# Patient Record
Sex: Female | Born: 1960
Health system: Southern US, Community
[De-identification: ages and names within clinical notes are randomized; demographics above are authoritative.]

## PROBLEM LIST (undated history)

## (undated) DIAGNOSIS — I10 Essential (primary) hypertension: Secondary | ICD-10-CM

## (undated) DIAGNOSIS — E079 Disorder of thyroid, unspecified: Secondary | ICD-10-CM

## (undated) DIAGNOSIS — E119 Type 2 diabetes mellitus without complications: Secondary | ICD-10-CM

## (undated) HISTORY — PX: ABDOMINAL HYSTERECTOMY: SHX81

## (undated) HISTORY — PX: CHOLECYSTECTOMY: SHX55

---
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
## 2002-04-03 HISTORY — PX: SHOULDER SURGERY: SHX246

## 2013-12-04 ENCOUNTER — Other Ambulatory Visit: Payer: Self-pay | Admitting: Family Medicine

## 2013-12-04 DIAGNOSIS — Z1231 Encounter for screening mammogram for malignant neoplasm of breast: Secondary | ICD-10-CM

## 2013-12-18 ENCOUNTER — Ambulatory Visit
Admission: RE | Admit: 2013-12-18 | Discharge: 2013-12-18 | Disposition: A | Discharge status: Home / Self Care | Payer: 59 | Source: Ambulatory Visit | Attending: Family Medicine | Admitting: Family Medicine

## 2013-12-18 DIAGNOSIS — Z1231 Encounter for screening mammogram for malignant neoplasm of breast: Secondary | ICD-10-CM

## 2014-12-09 ENCOUNTER — Other Ambulatory Visit: Payer: Self-pay

## 2014-12-09 DIAGNOSIS — Z1231 Encounter for screening mammogram for malignant neoplasm of breast: Secondary | ICD-10-CM

## 2015-01-13 ENCOUNTER — Ambulatory Visit
Admission: RE | Admit: 2015-01-13 | Discharge: 2015-01-13 | Disposition: A | Discharge status: Home / Self Care | Payer: 59 | Source: Ambulatory Visit

## 2015-01-13 DIAGNOSIS — Z1231 Encounter for screening mammogram for malignant neoplasm of breast: Secondary | ICD-10-CM

## 2016-01-06 ENCOUNTER — Other Ambulatory Visit: Payer: Self-pay | Admitting: Family Medicine

## 2016-01-06 DIAGNOSIS — Z1231 Encounter for screening mammogram for malignant neoplasm of breast: Secondary | ICD-10-CM

## 2016-01-20 ENCOUNTER — Ambulatory Visit
Admission: RE | Admit: 2016-01-20 | Discharge: 2016-01-20 | Disposition: A | Discharge status: Home / Self Care | Payer: 59 | Source: Ambulatory Visit | Attending: Family Medicine | Admitting: Family Medicine

## 2016-01-20 DIAGNOSIS — Z1231 Encounter for screening mammogram for malignant neoplasm of breast: Secondary | ICD-10-CM

## 2016-04-01 ENCOUNTER — Ambulatory Visit (HOSPITAL_COMMUNITY)
Admission: EM | Admit: 2016-04-01 | Discharge: 2016-04-01 | Disposition: A | Discharge status: Home / Self Care | Payer: 59 | Attending: Emergency Medicine | Admitting: Emergency Medicine

## 2016-04-01 ENCOUNTER — Encounter (HOSPITAL_COMMUNITY): Payer: Self-pay | Admitting: Emergency Medicine

## 2016-04-01 ENCOUNTER — Ambulatory Visit (INDEPENDENT_AMBULATORY_CARE_PROVIDER_SITE_OTHER): Payer: 59

## 2016-04-01 DIAGNOSIS — R112 Nausea with vomiting, unspecified: Secondary | ICD-10-CM

## 2016-04-01 DIAGNOSIS — J9801 Acute bronchospasm: Secondary | ICD-10-CM

## 2016-04-01 DIAGNOSIS — J069 Acute upper respiratory infection, unspecified: Secondary | ICD-10-CM

## 2016-04-01 HISTORY — DX: Disorder of thyroid, unspecified: E07.9

## 2016-04-01 HISTORY — DX: Essential (primary) hypertension: I10

## 2016-04-01 HISTORY — DX: Type 2 diabetes mellitus without complications: E11.9

## 2016-04-01 MED ORDER — IPRATROPIUM-ALBUTEROL 0.5-2.5 (3) MG/3ML IN SOLN
3.0000 mL | Freq: Once | RESPIRATORY_TRACT | Status: AC
  Administered 2016-04-01: 3 mL via RESPIRATORY_TRACT

## 2016-04-01 MED ORDER — ONDANSETRON HCL 4 MG PO TABS: 4.0000 mg | ORAL_TABLET | Freq: Four times a day (QID) | ORAL | 0 refills | Status: AC

## 2016-04-01 MED ORDER — IPRATROPIUM-ALBUTEROL 0.5-2.5 (3) MG/3ML IN SOLN
RESPIRATORY_TRACT | Status: AC
  Filled 2016-04-01: qty 3

## 2016-04-01 MED ORDER — ONDANSETRON 4 MG PO TBDP
4.0000 mg | ORAL_TABLET | Freq: Once | ORAL | Status: AC
  Administered 2016-04-01: 4 mg via ORAL

## 2016-04-01 MED ORDER — DEXAMETHASONE SODIUM PHOSPHATE 10 MG/ML IJ SOLN
10.0000 mg | Freq: Once | INTRAMUSCULAR | Status: AC
  Administered 2016-04-01: 10 mg via INTRAMUSCULAR

## 2016-04-01 MED ORDER — PREDNISONE 50 MG PO TABS: ORAL_TABLET | ORAL | 0 refills | Status: DC

## 2016-04-01 MED ORDER — ALBUTEROL SULFATE HFA 108 (90 BASE) MCG/ACT IN AERS: 2.0000 | INHALATION_SPRAY | RESPIRATORY_TRACT | 0 refills | Status: AC | PRN

## 2016-04-01 MED ORDER — DEXAMETHASONE SODIUM PHOSPHATE 10 MG/ML IJ SOLN
INTRAMUSCULAR | Status: AC
  Filled 2016-04-01: qty 1

## 2016-04-01 MED ORDER — ONDANSETRON 4 MG PO TBDP
ORAL_TABLET | ORAL | Status: AC
  Filled 2016-04-01: qty 1

## 2016-04-01 NOTE — ED Triage Notes (Signed)
Here for SOB onset 7 days associated w/vomiting, diarrhea, nasal congestion, prod cough, wheezing  Reports SOB increases w/activity  A&O x4... NAD

## 2016-04-01 NOTE — Discharge Instructions (Signed)
Use the albuterol inhaler 2 puffs every 4 hours as needed for cough, cough spasms or shortness of breath. You may continue taking the Advil sinus over-the-counter medicine to help with upper and toward congestion, sinus drainage and runny nose. This too should help with cough. They should drink plenty of fluids and stay well-hydrated. Take the prednisone once daily for inflammation of the airways and sinuses. Take with food.

## 2016-04-01 NOTE — ED Notes (Signed)
Notified Youlanda Roys, NP of pt's condition

## 2016-04-01 NOTE — ED Provider Notes (Signed)
CSN: [id number]     Arrival date & time 04/01/16  1634 History   First MD Initiated Contact with Patient 04/01/16 1743     Chief Complaint  Patient presents with  . Shortness of Breath   (Consider location/radiation/quality/duration/timing/severity/associated sxs/prior Treatment) 55 year old female states she has been feeling bad since Christmas Day. She has had vomiting and diarrhea but not daily. Complaining of upper respiratory congestion, nasal congestion, rhinorrhea, cough, feeling cold, ribs hurting with cough, dizziness. Denies fever. She has taken Advil sinus medication and Delsym. States she does not have asthma or smoke.      Past Medical History:  Diagnosis Date  . Diabetes mellitus without complication ([place])   . Hypertension   . Thyroid disease    History reviewed. No pertinent surgical history. History reviewed. No pertinent family history. Social History  Substance Use Topics  . Smoking status: Never Smoker  . Smokeless tobacco: Never Used  . Alcohol use Yes   OB History    No data available     Review of Systems  Constitutional: Positive for activity change and fatigue. Negative for appetite change, chills and fever.  HENT: Positive for congestion, postnasal drip, rhinorrhea and sneezing. Negative for ear pain, facial swelling and trouble swallowing.   Eyes: Negative.   Respiratory: Positive for cough, chest tightness and shortness of breath.   Cardiovascular: Negative.   Gastrointestinal: Positive for diarrhea, nausea and vomiting.  Musculoskeletal: Positive for myalgias. Negative for neck pain and neck stiffness.  Skin: Negative for pallor and rash.  Neurological: Negative.   All other systems reviewed and are negative.   Allergies  Patient has no known allergies.  Home Medications   Prior to Admission medications   Medication Sig Start Date End Date Taking? Authorizing Provider  fenofibrate (TRICOR) 145 MG tablet Take 145 mg by mouth daily.   Yes  Historical Provider, MD  insulin glargine (LANTUS) 100 UNIT/ML injection Inject into the skin at bedtime.   Yes Historical Provider, MD  insulin lispro (HUMALOG) 100 UNIT/ML injection Inject into the skin 3 (three) times daily before meals.   Yes Historical Provider, MD  levothyroxine (SYNTHROID, LEVOTHROID) 125 MCG tablet Take 125 mcg by mouth daily before breakfast.   Yes Historical Provider, MD  metFORMIN (GLUCOPHAGE) 1000 MG tablet Take 1,000 mg by mouth 2 (two) times daily with a meal.   Yes Historical Provider, MD  QUEtiapine (SEROQUEL) 300 MG tablet Take 300 mg by mouth at bedtime.   Yes Historical Provider, MD  rosuvastatin (CRESTOR) 10 MG tablet Take 10 mg by mouth daily.   Yes Historical Provider, MD  venlafaxine (EFFEXOR) 25 MG tablet Take 25 mg by mouth 2 (two) times daily.   Yes Historical Provider, MD  albuterol (PROVENTIL HFA;VENTOLIN HFA) 108 (90 Base) MCG/ACT inhaler Inhale 2 puffs into the lungs every 4 (four) hours as needed for wheezing or shortness of breath. 04/01/16   Janne Napoleon, NP  ondansetron (ZOFRAN) 4 MG tablet Take 1 tablet (4 mg total) by mouth every 6 (six) hours. Prn N or V 04/01/16   Janne Napoleon, NP  predniSONE (DELTASONE) 50 MG tablet 1 tab po daily for 6 days. Take with food. 04/01/16   Janne Napoleon, NP   Meds Ordered and Administered this Visit   Medications  ipratropium-albuterol (DUONEB) 0.5-2.5 (3) MG/3ML nebulizer solution 3 mL (3 mLs Nebulization Given 04/01/16 1809)  dexamethasone (DECADRON) injection 10 mg (10 mg Intramuscular Given 04/01/16 1809)  ondansetron (ZOFRAN-ODT) disintegrating tablet 4 mg (4 mg  Oral Given 04/01/16 1810)    BP 155/75 (BP Location: Left Arm)   Pulse 92   Temp 98.3 F (36.8 C) (Oral)   Resp 22   SpO2 100%  No data found.   Physical Exam  Constitutional: She is oriented to person, place, and time. She appears well-developed and well-nourished. No distress.  HENT:  Head: Normocephalic and atraumatic.  Bilateral TMs are  retracted Oropharynx with minor erythema and scant clear PND. Otherwise clear. No exudates.  Eyes: EOM are normal.  Neck: Normal range of motion. Neck supple.  Cardiovascular: Normal rate, regular rhythm, normal heart sounds and intact distal pulses.   Pulmonary/Chest: Effort normal. No respiratory distress.  Frequent cough and cough spasms. Tidal volume is clear however with forced expiration and cough there is bilateral coarseness. Mildly prolonged expiratory phase.  Musculoskeletal: Normal range of motion. She exhibits no edema.  Lymphadenopathy:    She has no cervical adenopathy.  Neurological: She is alert and oriented to person, place, and time.  Skin: Skin is warm and dry. No rash noted.  Psychiatric: She has a normal mood and affect.  Nursing note and vitals reviewed.   Urgent Care Course   Clinical Course     Procedures (including critical care time)  Labs Review Labs Reviewed - No data to display  Imaging Review Dg Chest 2 View  Result Date: 04/01/2016 CLINICAL DATA:  Patient with cough, vomiting and diarrhea. Congestion. EXAM: CHEST  2 VIEW COMPARISON:  None. FINDINGS: Normal cardiac and mediastinal contours. No consolidative pulmonary opacities. No pleural effusion or pneumothorax. Mid thoracic spine degenerative changes. Age-indeterminate compression deformity of a mid thoracic vertebral body. Additionally, there is suggestion of possible sclerosis of this vertebral body. IMPRESSION: Suggestion of age-indeterminate compression deformity of a mid thoracic vertebral body with associated sclerosis. Consider correlation with patient history as well as dedicated thoracic spine radiographs. No acute cardiopulmonary process. Electronically Signed   By: Lovey Newcomer M.D.   On: 04/01/2016 18:32     Visual Acuity Review  Right Eye Distance:   Left Eye Distance:   Bilateral Distance:    Right Eye Near:   Left Eye Near:    Bilateral Near:         MDM   1. Acute upper  respiratory infection   2. Cough due to bronchospasm   3. Non-intractable vomiting with nausea, unspecified vomiting type    Post DuoNeb the patient states she is breathing and feeling better. Her cough is decreased. Lungs are much clearer, improved air movement and decrease and wheezes and coarseness. Use the albuterol inhaler 2 puffs every 4 hours as needed for cough, cough spasms or shortness of breath. You may continue taking the Advil sinus over-the-counter medicine to help with upper and toward congestion, sinus drainage and runny nose. This too should help with cough. They should drink plenty of fluids and stay well-hydrated. Take the prednisone once daily for inflammation of the airways and sinuses. Take with food. Meds ordered this encounter  Medications  . insulin lispro (HUMALOG) 100 UNIT/ML injection    Sig: Inject into the skin 3 (three) times daily before meals.  . insulin glargine (LANTUS) 100 UNIT/ML injection    Sig: Inject into the skin at bedtime.  Marland Kitchen levothyroxine (SYNTHROID, LEVOTHROID) 125 MCG tablet    Sig: Take 125 mcg by mouth daily before breakfast.  . rosuvastatin (CRESTOR) 10 MG tablet    Sig: Take 10 mg by mouth daily.  . fenofibrate (TRICOR) 145 MG  tablet    Sig: Take 145 mg by mouth daily.  . QUEtiapine (SEROQUEL) 300 MG tablet    Sig: Take 300 mg by mouth at bedtime.  Marland Kitchen venlafaxine (EFFEXOR) 25 MG tablet    Sig: Take 25 mg by mouth 2 (two) times daily.  . metFORMIN (GLUCOPHAGE) 1000 MG tablet    Sig: Take 1,000 mg by mouth 2 (two) times daily with a meal.  . ipratropium-albuterol (DUONEB) 0.5-2.5 (3) MG/3ML nebulizer solution 3 mL  . dexamethasone (DECADRON) injection 10 mg  . ondansetron (ZOFRAN-ODT) disintegrating tablet 4 mg  . albuterol (PROVENTIL HFA;VENTOLIN HFA) 108 (90 Base) MCG/ACT inhaler    Sig: Inhale 2 puffs into the lungs every 4 (four) hours as needed for wheezing or shortness of breath.    Dispense:  1 Inhaler    Refill:  0    Order  Specific Question:   Supervising Provider    Answer:   Melony Overly [id number]  . predniSONE (DELTASONE) 50 MG tablet    Sig: 1 tab po daily for 6 days. Take with food.    Dispense:  6 tablet    Refill:  0    Order Specific Question:   Supervising Provider    Answer:   Melony Overly [id number]  . ondansetron (ZOFRAN) 4 MG tablet    Sig: Take 1 tablet (4 mg total) by mouth every 6 (six) hours. Prn N or V    Dispense:  12 tablet    Refill:  0    Order Specific Question:   Supervising Provider    Answer:   Carmela Hurt      Janne Napoleon, NP 04/01/16 1910

## 2016-05-04 ENCOUNTER — Ambulatory Visit
Admission: RE | Admit: 2016-05-04 | Discharge: 2016-05-04 | Disposition: A | Discharge status: Home / Self Care | Payer: 59 | Source: Ambulatory Visit | Attending: Family Medicine | Admitting: Family Medicine

## 2016-05-04 ENCOUNTER — Other Ambulatory Visit: Payer: Self-pay | Admitting: Family Medicine

## 2016-05-04 DIAGNOSIS — R319 Hematuria, unspecified: Secondary | ICD-10-CM | POA: Diagnosis not present

## 2016-05-04 DIAGNOSIS — N390 Urinary tract infection, site not specified: Secondary | ICD-10-CM | POA: Diagnosis not present

## 2016-05-04 DIAGNOSIS — R109 Unspecified abdominal pain: Secondary | ICD-10-CM

## 2016-05-08 ENCOUNTER — Emergency Department (HOSPITAL_COMMUNITY)
Admission: EM | Admit: 2016-05-08 | Discharge: 2016-05-08 | Disposition: A | Discharge status: Home / Self Care | Payer: 59 | Attending: Emergency Medicine | Admitting: Emergency Medicine

## 2016-05-08 ENCOUNTER — Encounter (HOSPITAL_COMMUNITY): Payer: Self-pay

## 2016-05-08 ENCOUNTER — Emergency Department (HOSPITAL_COMMUNITY): Payer: 59

## 2016-05-08 DIAGNOSIS — R1031 Right lower quadrant pain: Secondary | ICD-10-CM | POA: Diagnosis present

## 2016-05-08 DIAGNOSIS — N289 Disorder of kidney and ureter, unspecified: Secondary | ICD-10-CM | POA: Diagnosis not present

## 2016-05-08 DIAGNOSIS — N132 Hydronephrosis with renal and ureteral calculous obstruction: Secondary | ICD-10-CM | POA: Diagnosis not present

## 2016-05-08 DIAGNOSIS — N201 Calculus of ureter: Secondary | ICD-10-CM

## 2016-05-08 DIAGNOSIS — N202 Calculus of kidney with calculus of ureter: Secondary | ICD-10-CM | POA: Diagnosis not present

## 2016-05-08 DIAGNOSIS — R748 Abnormal levels of other serum enzymes: Secondary | ICD-10-CM | POA: Diagnosis not present

## 2016-05-08 DIAGNOSIS — E119 Type 2 diabetes mellitus without complications: Secondary | ICD-10-CM | POA: Insufficient documentation

## 2016-05-08 DIAGNOSIS — D649 Anemia, unspecified: Secondary | ICD-10-CM | POA: Diagnosis not present

## 2016-05-08 DIAGNOSIS — I10 Essential (primary) hypertension: Secondary | ICD-10-CM | POA: Diagnosis not present

## 2016-05-08 DIAGNOSIS — Z794 Long term (current) use of insulin: Secondary | ICD-10-CM | POA: Diagnosis not present

## 2016-05-08 LAB — COMPREHENSIVE METABOLIC PANEL
ALBUMIN: 4 g/dL (ref 3.5–5.0)
ALT: 19 U/L (ref 14–54)
AST: 30 U/L (ref 15–41)
Alkaline Phosphatase: 59 U/L (ref 38–126)
Anion gap: 11 (ref 5–15)
BILIRUBIN TOTAL: 0.4 mg/dL (ref 0.3–1.2)
BUN: 23 mg/dL — AB (ref 6–20)
CHLORIDE: 104 mmol/L (ref 101–111)
CO2: 20 mmol/L — ABNORMAL LOW (ref 22–32)
Calcium: 9.8 mg/dL (ref 8.9–10.3)
Creatinine, Ser: 1.14 mg/dL — ABNORMAL HIGH (ref 0.44–1.00)
GFR calc Af Amer: >60 mL/min (ref >60)
GFR calc non Af Amer: 53 mL/min — ABNORMAL LOW (ref >60)
GLUCOSE: 217 mg/dL — AB (ref 65–99)
POTASSIUM: 3.8 mmol/L (ref 3.5–5.1)
Sodium: 135 mmol/L (ref 135–145)
TOTAL PROTEIN: 6 g/dL — AB (ref 6.5–8.1)

## 2016-05-08 LAB — CBC WITH DIFFERENTIAL/PLATELET
Basophils Absolute: 0 10*3/uL (ref 0.0–0.1)
Basophils Relative: 0 %
Eosinophils Absolute: 0.1 10*3/uL (ref 0.0–0.7)
Eosinophils Relative: 1 %
HEMATOCRIT: 33.5 % — AB (ref 36.0–46.0)
Hemoglobin: 11.2 g/dL — ABNORMAL LOW (ref 12.0–15.0)
LYMPHS ABS: 1.2 10*3/uL (ref 0.7–4.0)
LYMPHS PCT: 16 %
MCH: 28.9 pg (ref 26.0–34.0)
MCHC: 33.4 g/dL (ref 30.0–36.0)
MCV: 86.3 fL (ref 78.0–100.0)
MONO ABS: 0.4 10*3/uL (ref 0.1–1.0)
MONOS PCT: 6 %
NEUTROS ABS: 5.9 10*3/uL (ref 1.7–7.7)
Neutrophils Relative %: 77 %
Platelets: 193 10*3/uL (ref 150–400)
RBC: 3.88 MIL/uL (ref 3.87–5.11)
RDW: 13.9 % (ref 11.5–15.5)
WBC: 7.6 10*3/uL (ref 4.0–10.5)

## 2016-05-08 LAB — LIPASE, BLOOD: Lipase: 168 U/L — ABNORMAL HIGH (ref 11–51)

## 2016-05-08 MED ORDER — ONDANSETRON HCL 4 MG PO TABS: 4.0000 mg | ORAL_TABLET | Freq: Four times a day (QID) | ORAL | 0 refills | Status: AC | PRN

## 2016-05-08 MED ORDER — MORPHINE SULFATE (PF) 4 MG/ML IV SOLN
4.0000 mg | Freq: Once | INTRAVENOUS | Status: AC
  Administered 2016-05-08: 4 mg via INTRAVENOUS
  Filled 2016-05-08: qty 1

## 2016-05-08 MED ORDER — OXYCODONE-ACETAMINOPHEN 5-325 MG PO TABS: 1.0000 | ORAL_TABLET | ORAL | 0 refills | Status: AC | PRN

## 2016-05-08 MED ORDER — ONDANSETRON HCL 4 MG/2ML IJ SOLN
4.0000 mg | Freq: Once | INTRAMUSCULAR | Status: AC
  Administered 2016-05-08: 4 mg via INTRAVENOUS
  Filled 2016-05-08: qty 2

## 2016-05-08 NOTE — ED Notes (Signed)
Patient transported to CT 

## 2016-05-08 NOTE — ED Triage Notes (Signed)
Pt states that she has kidney stones, went to her appt on Thursday and has another one on Tuesday. Pt states hematuria has returned. C/o nausea

## 2016-05-08 NOTE — ED Provider Notes (Signed)
[facility] DEPT Provider Note   CSN: [id number] Arrival date & time: 05/08/16  [id number] By signing my name below, I, Dyke Brackett, attest that this documentation has been prepared under the direction and in the presence of Delora Fuel, MD . Electronically Signed: Dyke Brackett, Scribe. 05/08/2016. 1:18 AM.   History   Chief Complaint Chief Complaint  Patient presents with  . Nephrolithiasis    HPI Maria Stafford is a 56 y.o. female with a history of DM, HTN and thyroid disease who presents to the Emergency Department complaining of gradually worsening, intermittent, moderate RLQ pain onset four days ago. Per pt, she had an x-ray and was dx with kidney stones at her PCP four days ago. She notes associated spasmatic pelvic pain, bilateral flank pain, nausea, vomiting, dysuria, hematuria, and increased urinary frequency. Pt has taken Ciprofloxacin, Ketoralac, and tamsulosin with no relief of symptoms. No alleviating or modifying factors noted. She states that she has a f/u appointment at her PCP tomorrow where she is to have another x-ray. She denies any fever, chills, sweats, or any other symptoms.   The history is provided by the patient. No language interpreter was used.   Past Medical History:  Diagnosis Date  . Diabetes mellitus without complication ([place])   . Hypertension   . Thyroid disease     There are no active problems to display for this patient.   History reviewed. No pertinent surgical history.  OB History    No data available     Home Medications    Prior to Admission medications   Medication Sig Start Date End Date Taking? Authorizing Provider  albuterol (PROVENTIL HFA;VENTOLIN HFA) 108 (90 Base) MCG/ACT inhaler Inhale 2 puffs into the lungs every 4 (four) hours as needed for wheezing or shortness of breath. 04/01/16   Janne Napoleon, NP  fenofibrate (TRICOR) 145 MG tablet Take 145 mg by mouth daily.    Historical Provider, MD  insulin glargine (LANTUS) 100 UNIT/ML  injection Inject into the skin at bedtime.    Historical Provider, MD  insulin lispro (HUMALOG) 100 UNIT/ML injection Inject into the skin 3 (three) times daily before meals.    Historical Provider, MD  levothyroxine (SYNTHROID, LEVOTHROID) 125 MCG tablet Take 125 mcg by mouth daily before breakfast.    Historical Provider, MD  metFORMIN (GLUCOPHAGE) 1000 MG tablet Take 1,000 mg by mouth 2 (two) times daily with a meal.    Historical Provider, MD  ondansetron (ZOFRAN) 4 MG tablet Take 1 tablet (4 mg total) by mouth every 6 (six) hours. Prn N or V 04/01/16   Janne Napoleon, NP  predniSONE (DELTASONE) 50 MG tablet 1 tab po daily for 6 days. Take with food. 04/01/16   Janne Napoleon, NP  QUEtiapine (SEROQUEL) 300 MG tablet Take 300 mg by mouth at bedtime.    Historical Provider, MD  rosuvastatin (CRESTOR) 10 MG tablet Take 10 mg by mouth daily.    Historical Provider, MD  venlafaxine (EFFEXOR) 25 MG tablet Take 25 mg by mouth 2 (two) times daily.    Historical Provider, MD    Family History No family history on file.  Social History Social History  Substance Use Topics  . Smoking status: Never Smoker  . Smokeless tobacco: Never Used  . Alcohol use Yes     Allergies   Patient has no known allergies.   Review of Systems Review of Systems  Constitutional: Negative for chills, diaphoresis and fever.  Gastrointestinal: Positive for abdominal pain, nausea and vomiting.  Genitourinary: Positive for dysuria, flank pain, frequency, hematuria and pelvic pain.  All other systems reviewed and are negative.  Physical Exam Updated Vital Signs BP 152/66   Pulse 106   Temp 98 F (36.7 C)   Resp 18   SpO2 96%   Physical Exam  Constitutional: She is oriented to person, place, and time. She appears well-developed and well-nourished.  HENT:  Head: Normocephalic and atraumatic.  Eyes: EOM are normal. Pupils are equal, round, and reactive to light.  Neck: Normal range of motion. Neck supple. No JVD  present.  Cardiovascular: Normal rate, regular rhythm and normal heart sounds.   No murmur heard. Pulmonary/Chest: Effort normal and breath sounds normal. She has no wheezes. She has no rales. She exhibits no tenderness.  Abdominal: Soft. Bowel sounds are normal. She exhibits no distension and no mass. There is tenderness.  Mild to moderate tenderness diffusely, with maximum tenderness in RLQ. Bilateral CVA tenderness present.   Musculoskeletal: Normal range of motion. She exhibits no edema.  Lymphadenopathy:    She has no cervical adenopathy.  Neurological: She is alert and oriented to person, place, and time. No cranial nerve deficit. She exhibits normal muscle tone. Coordination normal.  Skin: Skin is warm and dry. No rash noted.  Psychiatric: She has a normal mood and affect. Her behavior is normal. Judgment and thought content normal.  Nursing note and vitals reviewed.   ED Treatments / Results  DIAGNOSTIC STUDIES:  Oxygen Saturation is 96% on RA, normal by my interpretation.    COORDINATION OF CARE:  1:18 AM Discussed treatment plan with pt at bedside and pt agreed to plan.   Labs (all labs ordered are listed, but only abnormal results are displayed) Labs Reviewed  COMPREHENSIVE METABOLIC PANEL - Abnormal; Notable for the following:       Result Value   CO2 20 (*)    Glucose, Bld 217 (*)    BUN 23 (*)    Creatinine, Ser 1.14 (*)    Total Protein 6.0 (*)    GFR calc non Af Amer 53 (*)    All other components within normal limits  LIPASE, BLOOD - Abnormal; Notable for the following:    Lipase 168 (*)    All other components within normal limits  CBC WITH DIFFERENTIAL/PLATELET - Abnormal; Notable for the following:    Hemoglobin 11.2 (*)    HCT 33.5 (*)    All other components within normal limits    Radiology Ct Renal Stone Study  Result Date: 05/08/2016 CLINICAL DATA:  Recurrent hematuria and kidney stones. RIGHT lower quadrant pain, nausea. History of  hysterectomy, hypertension and diabetes. EXAM: CT ABDOMEN AND PELVIS WITHOUT CONTRAST TECHNIQUE: Multidetector CT imaging of the abdomen and pelvis was performed following the standard protocol without IV contrast. COMPARISON:  Abdominal radiograph May 04, 2016 FINDINGS: LOWER CHEST: Lung bases are clear. The visualized heart size is normal. No pericardial effusion. HEPATOBILIARY: Negative liver.  Status post cholecystectomy. PANCREAS: Normal. SPLEEN: Normal. ADRENALS/URINARY TRACT: Kidneys are orthotopic, demonstrating normal size and morphology. Mild RIGHT hydroureteronephrosis to the level of the ureterovesicular junction where a 3 mm calculus is present. Limited assessment for renal masses on this nonenhanced examination. Mild RIGHT perinephric fat stranding. No nephrolithiasis. Urinary bladder is partially distended and unremarkable. Normal adrenal glands. STOMACH/BOWEL: The stomach, small and large bowel are normal in course and caliber without inflammatory changes, sensitivity decreased by lack of enteric contrast. A few scattered colonic diverticula. The appendix is not discretely identified,  however there are no inflammatory changes in the right lower quadrant. VASCULAR/LYMPHATIC: Aortoiliac vessels are normal in course and caliber, trace calcific atherosclerosis. Phleboliths LEFT ovarian vein. No lymphadenopathy by CT size criteria. REPRODUCTIVE: Status post hysterectomy. OTHER: No intraperitoneal free fluid or free air. Calcified granuloma RIGHT anterior abdomen accounting for prior abdominal radiograph findings. MUSCULOSKELETAL: Non-acute. Moderate lower lumbar facet arthropathy. Anterior pelvic wall scarring. IMPRESSION: 3 mm RIGHT ureterovesicular junction calculus results in mild obstructive uropathy. No nephrolithiasis. Electronically Signed   By: Elon Alas M.D.   On: 05/08/2016 01:46    Procedures Procedures (including critical care time)  Medications Ordered in ED Medications    morphine 4 MG/ML injection 4 mg (4 mg Intravenous Given 05/08/16 0202)  ondansetron (ZOFRAN) injection 4 mg (4 mg Intravenous Given 05/08/16 0154)     Initial Impression / Assessment and Plan / ED Course  I have reviewed the triage vital signs and the nursing notes.  Pertinent labs & imaging results that were available during my care of the patient were reviewed by me and considered in my medical decision making (see chart for details).  Abdominal and flank pain of uncertain cause. Patient claims that she was diagnosed with kidney stone, but I reviewed the x-rays and the calcification seems much more likely to be a phlebolith than a kidney stone. We'll send for CT to clarify.  CT scan does show a 3 mm calculus at the right ureterovesical junction which is probably causing most of her pain. Laboratory workup shows elevated lipase but pancreas is normal-appearing on CT scan. Doubt she has pancreatitis. She feels much better after getting a dose of morphine and ondansetron. She is discharged with prescription for oxycodone have acetaminophen and ondansetron and is referred to urology for follow-up. Return precautions discussed.  Final Clinical Impressions(s) / ED Diagnoses   Final diagnoses:  Right distal ureteral calculus  Elevated lipase  Normochromic normocytic anemia  Renal insufficiency    New Prescriptions New Prescriptions   ONDANSETRON (ZOFRAN) 4 MG TABLET    Take 1 tablet (4 mg total) by mouth every 6 (six) hours as needed for nausea or vomiting.   OXYCODONE-ACETAMINOPHEN (PERCOCET) 5-325 MG TABLET    Take 1 tablet by mouth every 4 (four) hours as needed for moderate pain.   I personally performed the services described in this documentation, which was scribed in my presence. The recorded information has been reviewed and is accurate.       Delora Fuel, MD [id number]

## 2016-05-08 NOTE — Discharge Instructions (Signed)
Continue taking tamsulosin once a day. If ketorolac is causing stomach upset, you may substitute ibuprofen or naproxen.  If you develop a fever, go to the emergency department immediately!

## 2016-05-09 DIAGNOSIS — N200 Calculus of kidney: Secondary | ICD-10-CM | POA: Diagnosis not present

## 2016-05-26 DIAGNOSIS — E039 Hypothyroidism, unspecified: Secondary | ICD-10-CM | POA: Diagnosis not present

## 2016-05-26 DIAGNOSIS — E119 Type 2 diabetes mellitus without complications: Secondary | ICD-10-CM | POA: Diagnosis not present

## 2016-05-26 DIAGNOSIS — Z794 Long term (current) use of insulin: Secondary | ICD-10-CM | POA: Diagnosis not present

## 2016-05-30 DIAGNOSIS — E119 Type 2 diabetes mellitus without complications: Secondary | ICD-10-CM | POA: Diagnosis not present

## 2016-05-30 DIAGNOSIS — E782 Mixed hyperlipidemia: Secondary | ICD-10-CM | POA: Diagnosis not present

## 2016-05-30 DIAGNOSIS — I10 Essential (primary) hypertension: Secondary | ICD-10-CM | POA: Diagnosis not present

## 2016-07-12 DIAGNOSIS — J309 Allergic rhinitis, unspecified: Secondary | ICD-10-CM | POA: Diagnosis not present

## 2016-07-12 DIAGNOSIS — I10 Essential (primary) hypertension: Secondary | ICD-10-CM | POA: Diagnosis not present

## 2016-07-12 DIAGNOSIS — H109 Unspecified conjunctivitis: Secondary | ICD-10-CM | POA: Diagnosis not present

## 2016-07-14 DIAGNOSIS — I10 Essential (primary) hypertension: Secondary | ICD-10-CM | POA: Diagnosis not present

## 2016-07-25 DIAGNOSIS — H9393 Unspecified disorder of ear, bilateral: Secondary | ICD-10-CM | POA: Diagnosis not present

## 2016-07-25 DIAGNOSIS — J309 Allergic rhinitis, unspecified: Secondary | ICD-10-CM | POA: Diagnosis not present

## 2016-08-29 DIAGNOSIS — Z794 Long term (current) use of insulin: Secondary | ICD-10-CM | POA: Diagnosis not present

## 2016-08-29 DIAGNOSIS — E119 Type 2 diabetes mellitus without complications: Secondary | ICD-10-CM | POA: Diagnosis not present

## 2016-08-29 DIAGNOSIS — E039 Hypothyroidism, unspecified: Secondary | ICD-10-CM | POA: Diagnosis not present

## 2016-08-31 DIAGNOSIS — E782 Mixed hyperlipidemia: Secondary | ICD-10-CM | POA: Diagnosis not present

## 2016-08-31 DIAGNOSIS — E119 Type 2 diabetes mellitus without complications: Secondary | ICD-10-CM | POA: Diagnosis not present

## 2016-08-31 DIAGNOSIS — I10 Essential (primary) hypertension: Secondary | ICD-10-CM | POA: Diagnosis not present

## 2016-09-12 DIAGNOSIS — J309 Allergic rhinitis, unspecified: Secondary | ICD-10-CM | POA: Diagnosis not present

## 2016-09-12 DIAGNOSIS — D179 Benign lipomatous neoplasm, unspecified: Secondary | ICD-10-CM | POA: Diagnosis not present

## 2016-09-12 DIAGNOSIS — T753XXA Motion sickness, initial encounter: Secondary | ICD-10-CM | POA: Diagnosis not present

## 2016-11-14 DIAGNOSIS — E119 Type 2 diabetes mellitus without complications: Secondary | ICD-10-CM | POA: Diagnosis not present

## 2016-11-14 DIAGNOSIS — E039 Hypothyroidism, unspecified: Secondary | ICD-10-CM | POA: Diagnosis not present

## 2016-11-14 DIAGNOSIS — Z794 Long term (current) use of insulin: Secondary | ICD-10-CM | POA: Diagnosis not present

## 2016-11-16 DIAGNOSIS — E119 Type 2 diabetes mellitus without complications: Secondary | ICD-10-CM | POA: Diagnosis not present

## 2016-11-16 DIAGNOSIS — I10 Essential (primary) hypertension: Secondary | ICD-10-CM | POA: Diagnosis not present

## 2016-11-16 DIAGNOSIS — E782 Mixed hyperlipidemia: Secondary | ICD-10-CM | POA: Diagnosis not present

## 2016-11-17 DIAGNOSIS — Z0000 Encounter for general adult medical examination without abnormal findings: Secondary | ICD-10-CM | POA: Diagnosis not present

## 2016-11-20 DIAGNOSIS — M7542 Impingement syndrome of left shoulder: Secondary | ICD-10-CM | POA: Diagnosis not present

## 2016-11-21 ENCOUNTER — Other Ambulatory Visit: Payer: Self-pay | Admitting: Family Medicine

## 2016-11-21 DIAGNOSIS — Z0000 Encounter for general adult medical examination without abnormal findings: Secondary | ICD-10-CM | POA: Diagnosis not present

## 2016-11-21 DIAGNOSIS — I10 Essential (primary) hypertension: Secondary | ICD-10-CM | POA: Diagnosis not present

## 2016-11-21 DIAGNOSIS — Z23 Encounter for immunization: Secondary | ICD-10-CM | POA: Diagnosis not present

## 2016-11-21 DIAGNOSIS — Z1231 Encounter for screening mammogram for malignant neoplasm of breast: Secondary | ICD-10-CM

## 2016-12-05 DIAGNOSIS — M7502 Adhesive capsulitis of left shoulder: Secondary | ICD-10-CM | POA: Diagnosis not present

## 2016-12-05 DIAGNOSIS — M25512 Pain in left shoulder: Secondary | ICD-10-CM | POA: Diagnosis not present

## 2016-12-05 DIAGNOSIS — M75112 Incomplete rotator cuff tear or rupture of left shoulder, not specified as traumatic: Secondary | ICD-10-CM | POA: Diagnosis not present

## 2016-12-05 DIAGNOSIS — S46212A Strain of muscle, fascia and tendon of other parts of biceps, left arm, initial encounter: Secondary | ICD-10-CM | POA: Diagnosis not present

## 2016-12-06 DIAGNOSIS — I10 Essential (primary) hypertension: Secondary | ICD-10-CM | POA: Diagnosis not present

## 2016-12-06 DIAGNOSIS — M7580 Other shoulder lesions, unspecified shoulder: Secondary | ICD-10-CM | POA: Diagnosis not present

## 2016-12-06 DIAGNOSIS — Z23 Encounter for immunization: Secondary | ICD-10-CM | POA: Diagnosis not present

## 2016-12-06 DIAGNOSIS — E119 Type 2 diabetes mellitus without complications: Secondary | ICD-10-CM | POA: Diagnosis not present

## 2016-12-19 DIAGNOSIS — T753XXA Motion sickness, initial encounter: Secondary | ICD-10-CM | POA: Diagnosis not present

## 2016-12-19 DIAGNOSIS — I10 Essential (primary) hypertension: Secondary | ICD-10-CM | POA: Diagnosis not present

## 2016-12-19 DIAGNOSIS — Z23 Encounter for immunization: Secondary | ICD-10-CM | POA: Diagnosis not present

## 2016-12-28 DIAGNOSIS — M25512 Pain in left shoulder: Secondary | ICD-10-CM | POA: Diagnosis not present

## 2016-12-28 DIAGNOSIS — M7502 Adhesive capsulitis of left shoulder: Secondary | ICD-10-CM | POA: Diagnosis not present

## 2016-12-28 DIAGNOSIS — M7542 Impingement syndrome of left shoulder: Secondary | ICD-10-CM | POA: Diagnosis not present

## 2016-12-28 DIAGNOSIS — M19012 Primary osteoarthritis, left shoulder: Secondary | ICD-10-CM | POA: Diagnosis not present

## 2016-12-28 DIAGNOSIS — G8918 Other acute postprocedural pain: Secondary | ICD-10-CM | POA: Diagnosis not present

## 2016-12-28 HISTORY — PX: SHOULDER SURGERY: SHX246

## 2017-01-01 ENCOUNTER — Encounter: Payer: Self-pay | Admitting: Physical Therapy

## 2017-01-01 ENCOUNTER — Ambulatory Visit: Payer: 59 | Attending: Physician Assistant | Admitting: Physical Therapy

## 2017-01-01 DIAGNOSIS — M6281 Muscle weakness (generalized): Secondary | ICD-10-CM

## 2017-01-01 DIAGNOSIS — M25612 Stiffness of left shoulder, not elsewhere classified: Secondary | ICD-10-CM | POA: Diagnosis not present

## 2017-01-01 DIAGNOSIS — M25512 Pain in left shoulder: Secondary | ICD-10-CM | POA: Diagnosis not present

## 2017-01-01 NOTE — Therapy (Signed)
[place], [place], [address] Phone: [phone]   Fax:  [phone]  Physical Therapy Evaluation  Patient Details  Name: Maria Stafford MRN: [id number] Date of Birth: [birth year] Referring Provider: Durene Fruits, PA-C Blanche East, MD - surgeon)  Encounter Date: 01/01/2017      PT End of Session - 01/01/17 1021    Visit Number 1   Number of Visits 13   Date for PT Re-Evaluation 02/16/17   Authorization Type UHC   PT Start Time 1021   PT Stop Time 1107   PT Time Calculation (min) 46 min   Activity Tolerance Patient tolerated treatment well   Behavior During Therapy [facility] for tasks assessed/performed      Past Medical History:  Diagnosis Date  . Diabetes mellitus without complication ([place])   . Hypertension   . Thyroid disease     Past Surgical History:  Procedure Laterality Date  . ABDOMINAL HYSTERECTOMY    . APPENDECTOMY  1980  . [place]  . CHOLECYSTECTOMY    . SHOULDER SURGERY Right 2004  . SHOULDER SURGERY Left 12/28/2016    There were no vitals filed for this visit.       Subjective Assessment - 01/01/17 1026    Subjective Pt reports manipulation & decompression last Thursday. It hurts really bad. Has been trying to reach and uses pulleys at home. difficulty reaching behind back. Feeling nauseated today, did not sleep well.    Patient Stated Goals driving, reaching   Currently in Pain? Yes   Pain Score 7    Pain Location Shoulder   Pain Orientation Left   Pain Descriptors / Indicators Sore;Sharp   Aggravating Factors  moving around   Pain Relieving Factors ice, pulleys            [facility] PT Assessment - 01/01/17 0001      Assessment   Medical Diagnosis L shoulder impingement & adhesive capsulitis   Referring Provider Kyla Balzarine Wall, PA-C  Blanche East, MD - surgeon   Onset Date/Surgical Date 12/28/16   Hand Dominance Right   Next MD Visit  --  not scheduled   Prior Therapy no     Precautions   Precautions None     Restrictions   Weight Bearing Restrictions No     Balance Screen   Has the patient fallen in the past 6 months No     [place] residence   Living Arrangements Spouse/significant other     Prior Function   Level of Independence Independent   Vocation --  not working     Cognition   Overall Cognitive Status Within Functional Limits for tasks assessed     Sensation   Additional Comments WFL     ROM / Strength   AROM / PROM / Strength AROM;Strength     AROM   Overall AROM Comments PROM WFL with pain   AROM Assessment Site Shoulder   Right/Left Shoulder Left   Left Shoulder Flexion 125 Degrees   Left Shoulder ABduction 100 Degrees   Left Shoulder Internal Rotation --  L4, not to midline   Left Shoulder External Rotation --  C6     Strength   Overall Strength Comments gross GHJ strength 3-/5; R grup 40lb, L 35   Strength Assessment Site Shoulder   Right/Left Shoulder Left     Palpation   Palpation comment denies TTP  Objective measurements completed on examination: See above findings.          [facility] Adult PT Treatment/Exercise - 01/01/17 0001      Exercises   Exercises Shoulder     Shoulder Exercises: Supine   External Rotation Limitations with wand   Flexion 12 reps   Flexion Limitations flexion with wand     Shoulder Exercises: Stretch   Internal Rotation Stretch Limitations with strap behind back   Other Shoulder Stretches pendulums     Modalities   Modalities Cryotherapy     Cryotherapy   Number Minutes Cryotherapy 10 Minutes  2 min concurrent with HEP + POC education   Cryotherapy Location Shoulder   Type of Cryotherapy Ice pack     Manual Therapy   Manual Therapy Passive ROM   Passive ROM L GHJ flexion, ER                PT Education - 01/01/17 1443    Education provided Yes   Education Details  anatomy of condition, POC, HEP, exercise form/rationale   Person(s) Educated Patient   Methods Explanation;Demonstration;Tactile cues;Verbal cues;Handout   Comprehension Verbalized understanding;Returned demonstration;Verbal cues required;Tactile cues required;Need further instruction          PT Short Term Goals - 01/01/17 1444      PT SHORT TERM GOAL #1   Title Improved flexion and abduction by 15 deg actively for improved overhead reach   Baseline see flowsheet   Time 3   Period Weeks   Status New   Target Date 01/26/17     PT SHORT TERM GOAL #2   Title Average pain to <=5/10 to decrease pain limitations on daily activities   Baseline 7/10 at eval   Time 3   Period Weeks   Status New   Target Date 01/26/17           PT Long Term Goals - 01/01/17 1446      PT LONG TERM GOAL #1   Title Pt will be able to reach overhead without limitations by shoulder pain   Baseline pain with reaching at eval   Time 6   Period Weeks   Status New   Target Date 02/16/17     PT LONG TERM GOAL #2   Title Pt will be able to don/doff all clothing without limitation by shoulder pain   Baseline unable to reach bra clasp at eval   Time 6   Period Weeks   Status New   Target Date 02/16/17     PT LONG TERM GOAL #3   Title Gross GHJ strength to 5/5 for proper support to UE biomechanical chain   Baseline gross 3-/5 at eval   Time 6   Period Weeks   Status New   Target Date 02/16/17     PT LONG TERM GOAL #4   Title Pt will be able to drive without limitation by shoulder pain   Baseline pain at eval   Time 6   Period Weeks   Status New   Target Date 02/16/17                Plan - 01/01/17 1315    Clinical Impression Statement Pt presents to PT with complaints of L shoulder pain s/p manipulation & decompression (pt reported) last Thursday, diagnosis of L shoulder impingement & adhesive capsulitis. Pt demo good PROM with pain and limited AROM due to pain. No signs of  infection. Pt will benefit from  skilled PT in order to improve functional use of L upper extremity.    History and Personal Factors relevant to plan of care: DM, HTN   Clinical Presentation Stable   Clinical Presentation due to: n/a   Clinical Decision Making Low   Rehab Potential Good   PT Frequency 2x / week   PT Duration 6 weeks   PT Treatment/Interventions ADLs/Self Care Home Management;Cryotherapy;Electrical Stimulation;Functional mobility training;Ultrasound;Moist Heat;Therapeutic activities;Therapeutic exercise;Neuromuscular re-education;Patient/family education;Passive range of motion;Manual techniques;Taping   PT Next Visit Plan passive & AAROM to tolerance, periscapular strengthening   PT Home Exercise Plan scapular retraction, supine flx with wand, supine ER wand, IR with strap   Consulted and Agree with Plan of Care Patient      Patient will benefit from skilled therapeutic intervention in order to improve the following deficits and impairments:  Decreased range of motion, Impaired UE functional use, Increased muscle spasms, Decreased activity tolerance, Pain, Improper body mechanics, Decreased strength, Postural dysfunction  Visit Diagnosis: Acute pain of left shoulder  Stiffness of left shoulder, not elsewhere classified  Muscle weakness (generalized)     Problem List There are no active problems to display for this patient. Kateland Leisinger C. Erasmo Vertz PT, DPT 01/01/17 2:51 PM   [facility] [address] [place], [place], [address] Phone: [phone]   Fax:  [phone]  Name: Jaleena Viviani MRN: [id number] Date of Birth: [birth year]

## 2017-01-02 ENCOUNTER — Ambulatory Visit: Payer: 59 | Admitting: Physical Therapy

## 2017-01-02 DIAGNOSIS — M6281 Muscle weakness (generalized): Secondary | ICD-10-CM

## 2017-01-02 DIAGNOSIS — M25612 Stiffness of left shoulder, not elsewhere classified: Secondary | ICD-10-CM

## 2017-01-02 DIAGNOSIS — M25512 Pain in left shoulder: Secondary | ICD-10-CM | POA: Diagnosis not present

## 2017-01-02 NOTE — Therapy (Signed)
Maria Stafford, [place], [address] Phone: [phone]   Fax:  [phone]  Physical Therapy Treatment  Patient Details  Name: Maria Stafford MRN: [id number] Date of Birth: November 23, 1960 Referring Provider: Durene Fruits, PA-C Blanche East, MD - surgeon)  Encounter Date: 01/02/2017      PT End of Session - 01/02/17 1143    Visit Number 2   Number of Visits 13   Date for PT Re-Evaluation 02/16/17   Authorization Type UHC   PT Start Time 1100   PT Stop Time 1155   PT Time Calculation (min) 55 min      Past Medical History:  Diagnosis Date  . Diabetes mellitus without complication ([place])   . Hypertension   . Thyroid disease     Past Surgical History:  Procedure Laterality Date  . ABDOMINAL HYSTERECTOMY    . APPENDECTOMY  1980  . [place]  . CHOLECYSTECTOMY    . SHOULDER SURGERY Right 2004  . SHOULDER SURGERY Left 12/28/2016    There were no vitals filed for this visit.      Subjective Assessment - 01/02/17 1101    Currently in Pain? Yes   Pain Score 7    Pain Location Shoulder   Pain Orientation Left;Lateral   Pain Descriptors / Indicators Sore;Sharp;Stabbing            [facility] PT Assessment - 01/02/17 0001      AROM   Left Shoulder External Rotation 40 Degrees                     [facility] Adult PT Treatment/Exercise - 01/02/17 0001      Shoulder Exercises: Supine   External Rotation Limitations with wand   Flexion 15 reps   Flexion Limitations flexion with wand     Shoulder Exercises: Standing   Other Standing Exercises UE ranger >90 flexion, horizontal abduction/adduction, ER , IR behind back at low level    Other Standing Exercises wall ladder flexion 5 x      Shoulder Exercises: Stretch   Internal Rotation Stretch Limitations opp hand assist behind back      Cryotherapy   Number Minutes Cryotherapy 15 Minutes   Cryotherapy Location  Shoulder   Type of Cryotherapy Ice pack     Manual Therapy   Passive ROM L GHJ flexion, ER, abducion below 90                PT Education - 01/01/17 1443    Education provided Yes   Education Details anatomy of condition, POC, HEP, exercise form/rationale   Person(s) Educated Patient   Methods Explanation;Demonstration;Tactile cues;Verbal cues;Handout   Comprehension Verbalized understanding;Returned demonstration;Verbal cues required;Tactile cues required;Need further instruction          PT Short Term Goals - 01/01/17 1444      PT SHORT TERM GOAL #1   Title Improved flexion and abduction by 15 deg actively for improved overhead reach   Baseline see flowsheet   Time 3   Period Weeks   Status New   Target Date 01/26/17     PT SHORT TERM GOAL #2   Title Average pain to <=5/10 to decrease pain limitations on daily activities   Baseline 7/10 at eval   Time 3   Period Weeks   Status New   Target Date 01/26/17           PT Long Term Goals -  01/01/17 1446      PT LONG TERM GOAL #1   Title Pt will be able to reach overhead without limitations by shoulder pain   Baseline pain with reaching at eval   Time 6   Period Weeks   Status New   Target Date 02/16/17     PT LONG TERM GOAL #2   Title Pt will be able to don/doff all clothing without limitation by shoulder pain   Baseline unable to reach bra clasp at eval   Time 6   Period Weeks   Status New   Target Date 02/16/17     PT LONG TERM GOAL #3   Title Gross GHJ strength to 5/5 for proper support to UE biomechanical chain   Baseline gross 3-/5 at eval   Time 6   Period Weeks   Status New   Target Date 02/16/17     PT LONG TERM GOAL #4   Title Pt will be able to drive without limitation by shoulder pain   Baseline pain at eval   Time 6   Period Weeks   Status New   Target Date 02/16/17               Plan - 01/02/17 1141    Clinical Impression Statement Pt reports same pain and soreness  as yesterday at eval.  We reviewed HEP and used UE ranger for [place]. Very tender anterior shoulder and along clavicle. Cues for relaxation improved tolerance to PROM. Ice post session.    PT Next Visit Plan passive & AAROM to tolerance, periscapular strengthening   PT Home Exercise Plan scapular retraction, supine flx with wand, supine ER wand, IR with strap   Consulted and Agree with Plan of Care Patient      Patient will benefit from skilled therapeutic intervention in order to improve the following deficits and impairments:  Decreased range of motion, Impaired UE functional use, Increased muscle spasms, Decreased activity tolerance, Pain, Improper body mechanics, Decreased strength, Postural dysfunction  Visit Diagnosis: Acute pain of left shoulder  Stiffness of left shoulder, not elsewhere classified  Muscle weakness (generalized)     Problem List There are no active problems to display for this patient.   Dorene Ar, [place] 01/02/2017, 11:44 AM  [facility] [address] [place], [place], [address] Phone: [phone]   Fax:  [phone]  Name: Maria Stafford MRN: [id number] Date of Birth: 13-Oct-1960

## 2017-01-08 ENCOUNTER — Ambulatory Visit: Payer: 59 | Admitting: Physical Therapy

## 2017-01-08 DIAGNOSIS — M25512 Pain in left shoulder: Secondary | ICD-10-CM

## 2017-01-08 DIAGNOSIS — M6281 Muscle weakness (generalized): Secondary | ICD-10-CM

## 2017-01-08 DIAGNOSIS — M25612 Stiffness of left shoulder, not elsewhere classified: Secondary | ICD-10-CM

## 2017-01-08 NOTE — Therapy (Signed)
[place], [place], [address] Phone: [phone]   Fax:  [phone]  Physical Therapy Treatment  Patient Details  Name: Maria Stafford MRN: [id number] Date of Birth: [birth year] Referring Provider: Durene Fruits, PA-C Blanche East, MD - surgeon)  Encounter Date: 01/08/2017      PT End of Session - 01/08/17 1112    Visit Number 3   Number of Visits 13   Date for PT Re-Evaluation 02/16/17   Authorization Type UHC   PT Start Time 1110  10 minutes    PT Stop Time 1200   PT Time Calculation (min) 50 min      Past Medical History:  Diagnosis Date  . Diabetes mellitus without complication ([place])   . Hypertension   . Thyroid disease     Past Surgical History:  Procedure Laterality Date  . ABDOMINAL HYSTERECTOMY    . APPENDECTOMY  1980  . [place]  . CHOLECYSTECTOMY    . SHOULDER SURGERY Right 2004  . SHOULDER SURGERY Left 12/28/2016    There were no vitals filed for this visit.      Subjective Assessment - 01/08/17 1111    Subjective I have turned a corner with the pain. Its better.    Currently in Pain? Yes   Pain Score 5    Pain Location Shoulder   Pain Orientation Left   Pain Descriptors / Indicators Aching   Aggravating Factors  reachinging back behind head or back    Pain Relieving Factors ice pulleys             [facility] PT Assessment - 01/08/17 0001      AROM   Left Shoulder Flexion 132 Degrees   Left Shoulder ABduction 128 Degrees   Left Shoulder Internal Rotation --  reach to L1   Left Shoulder External Rotation --  Reach to T2                      [facility] Adult PT Treatment/Exercise - 01/08/17 0001      Shoulder Exercises: Supine   Horizontal ABduction AAROM;10 reps   External Rotation Limitations with wand   Flexion 15 reps   Flexion Limitations flexion with wand     Shoulder Exercises: Standing   Internal Rotation AAROM;10  reps   Internal Rotation Limitations cane   Flexion AAROM;Left;10 reps   Flexion Limitations cane    ABduction AAROM;10 reps   ABduction Limitations cane   Extension AAROM;10 reps   Extension Limitations cane     Shoulder Exercises: Pulleys   Flexion 2 minutes     Cryotherapy   Number Minutes Cryotherapy 15 Minutes   Cryotherapy Location Shoulder   Type of Cryotherapy Ice pack     Manual Therapy   Passive ROM L GHJ flexion, ER, abducion below 90 only tolerated                   PT Short Term Goals - 01/01/17 1444      PT SHORT TERM GOAL #1   Title Improved flexion and abduction by 15 deg actively for improved overhead reach   Baseline see flowsheet   Time 3   Period Weeks   Status New   Target Date 01/26/17     PT SHORT TERM GOAL #2   Title Average pain to <=5/10 to decrease pain limitations on daily activities   Baseline 7/10 at eval  Time 3   Period Weeks   Status New   Target Date 01/26/17           PT Long Term Goals - 01/01/17 1446      PT LONG TERM GOAL #1   Title Pt will be able to reach overhead without limitations by shoulder pain   Baseline pain with reaching at eval   Time 6   Period Weeks   Status New   Target Date 02/16/17     PT LONG TERM GOAL #2   Title Pt will be able to don/doff all clothing without limitation by shoulder pain   Baseline unable to reach bra clasp at eval   Time 6   Period Weeks   Status New   Target Date 02/16/17     PT LONG TERM GOAL #3   Title Gross GHJ strength to 5/5 for proper support to UE biomechanical chain   Baseline gross 3-/5 at eval   Time 6   Period Weeks   Status New   Target Date 02/16/17     PT LONG TERM GOAL #4   Title Pt will be able to drive without limitation by shoulder pain   Baseline pain at eval   Time 6   Period Weeks   Status New   Target Date 02/16/17               Plan - 01/08/17 1127    Clinical Impression Statement Improving ROM, see objective measures.   Decreasing pain. Pt reports significant pain with reaching behind back and reaching at 90/90. Focused PROM and AAROM exercises as tolerated. Began scapular strengthening and doorway stretch with good tolearnce.    PT Next Visit Plan passive & AAROM to tolerance, periscapular strengthening   PT Home Exercise Plan scapular retraction, supine flx with wand, supine ER wand, IR with strap   Consulted and Agree with Plan of Care Patient      Patient will benefit from skilled therapeutic intervention in order to improve the following deficits and impairments:  Decreased range of motion, Impaired UE functional use, Increased muscle spasms, Decreased activity tolerance, Pain, Improper body mechanics, Decreased strength, Postural dysfunction  Visit Diagnosis: Acute pain of left shoulder  Stiffness of left shoulder, not elsewhere classified  Muscle weakness (generalized)     Problem List There are no active problems to display for this patient.   Dorene Ar, [place] 01/08/2017, 11:49 AM  [place], [place], [address] Phone: [phone]   Fax:  [phone]  Name: Maria Stafford MRN: [id number] Date of Birth: [birth year]

## 2017-01-09 ENCOUNTER — Ambulatory Visit: Payer: 59 | Admitting: Physical Therapy

## 2017-01-09 ENCOUNTER — Encounter: Payer: Self-pay | Admitting: Physical Therapy

## 2017-01-09 DIAGNOSIS — M25512 Pain in left shoulder: Secondary | ICD-10-CM | POA: Diagnosis not present

## 2017-01-09 DIAGNOSIS — M6281 Muscle weakness (generalized): Secondary | ICD-10-CM

## 2017-01-09 DIAGNOSIS — M25612 Stiffness of left shoulder, not elsewhere classified: Secondary | ICD-10-CM

## 2017-01-09 NOTE — Therapy (Addendum)
[place], [place], [address] Phone: [phone]   Fax:  [phone]  Physical Therapy Treatment/Discharge Summary  Patient Details  Name: Maria Stafford MRN: [id number] Date of Birth: 1960-06-20 Referring Provider: Durene Fruits, PA-C Blanche East, MD - surgeon)  Encounter Date: 01/09/2017      PT End of Session - 01/09/17 1146    Visit Number 4   Number of Visits 13   Date for PT Re-Evaluation 02/16/17   Authorization Type UHC   PT Start Time 1146   PT Stop Time 1233   PT Time Calculation (min) 47 min   Activity Tolerance Patient tolerated treatment well   Behavior During Therapy [facility] for tasks assessed/performed      Past Medical History:  Diagnosis Date  . Diabetes mellitus without complication ([place])   . Hypertension   . Thyroid disease     Past Surgical History:  Procedure Laterality Date  . ABDOMINAL HYSTERECTOMY    . APPENDECTOMY  1980  . [place]  . CHOLECYSTECTOMY    . SHOULDER SURGERY Right 2004  . SHOULDER SURGERY Left 12/28/2016    There were no vitals filed for this visit.      Subjective Assessment - 01/09/17 1146    Subjective This is the first time I have been here feeling rested. Shoulder feels pretty good, tight on anterior chest wall.    Patient Stated Goals driving, reaching   Currently in Pain? Yes   Pain Score 4    Pain Location Shoulder   Pain Orientation Left                         [facility] Adult PT Treatment/Exercise - 01/10/17 0001      Cryotherapy   Number Minutes Cryotherapy 15 Minutes   Cryotherapy Location Shoulder   Type of Cryotherapy Ice pack     Manual Therapy   Manual Therapy (P)  Joint mobilization;Soft tissue mobilization   Joint Mobilization (P)  gr 2&3 mobs end range ER AP, distraction   Passive ROM (P)  PROM with stretch in all planes to tolerance                  PT Short Term Goals  - 01/01/17 1444      PT SHORT TERM GOAL #1   Title Improved flexion and abduction by 15 deg actively for improved overhead reach   Baseline see flowsheet   Time 3   Period Weeks   Status New   Target Date 01/26/17     PT SHORT TERM GOAL #2   Title Average pain to <=5/10 to decrease pain limitations on daily activities   Baseline 7/10 at eval   Time 3   Period Weeks   Status New   Target Date 01/26/17           PT Long Term Goals - 01/01/17 1446      PT LONG TERM GOAL #1   Title Pt will be able to reach overhead without limitations by shoulder pain   Baseline pain with reaching at eval   Time 6   Period Weeks   Status New   Target Date 02/16/17     PT LONG TERM GOAL #2   Title Pt will be able to don/doff all clothing without limitation by shoulder pain   Baseline unable to reach bra clasp at eval   Time 6  Period Weeks   Status New   Target Date 02/16/17     PT LONG TERM GOAL #3   Title Gross GHJ strength to 5/5 for proper support to UE biomechanical chain   Baseline gross 3-/5 at eval   Time 6   Period Weeks   Status New   Target Date 02/16/17     PT LONG TERM GOAL #4   Title Pt will be able to drive without limitation by shoulder pain   Baseline pain at eval   Time 6   Period Weeks   Status New   Target Date 02/16/17               Plan - 01/09/17 1219    Clinical Impression Statement Focused on passive motion and mobilization of GHJ today since she did so many exercises yesterday. I warned her that she may be a little more sore due to increased stretch. cont to have pain at end range but noted improvement with stretchy end feel.    PT Treatment/Interventions ADLs/Self Care Home Management;Cryotherapy;Electrical Stimulation;Functional mobility training;Ultrasound;Moist Heat;Therapeutic activities;Therapeutic exercise;Neuromuscular re-education;Patient/family education;Passive range of motion;Manual techniques;Taping   PT Next Visit Plan passive &  AAROM to tolerance, periscapular strengthening   PT Home Exercise Plan scapular retraction, supine flx with wand, supine ER wand, IR with strap   Consulted and Agree with Plan of Care Patient      Patient will benefit from skilled therapeutic intervention in order to improve the following deficits and impairments:  Decreased range of motion, Impaired UE functional use, Increased muscle spasms, Decreased activity tolerance, Pain, Improper body mechanics, Decreased strength, Postural dysfunction  Visit Diagnosis: Acute pain of left shoulder  Stiffness of left shoulder, not elsewhere classified  Muscle weakness (generalized)     Problem List There are no active problems to display for this patient.   Datron Brakebill C. Sharifa Bucholz PT, DPT 01/10/17 7:57 AM   [facility] [address] [place], [place], [address] Phone: [phone]   Fax:  [phone]  Name: Maria Stafford MRN: [id number] Date of Birth: 04/18/1960  PHYSICAL THERAPY DISCHARGE SUMMARY  Visits from Start of Care: 4  Current functional level related to goals / functional outcomes: See above   Remaining deficits: See above   Education / Equipment: Anatomy of condition, POC, HEP, exercise form/rationale  Plan: Patient agrees to discharge.  Patient goals were not met. Patient is being discharged due to the patient's request.  ?????  Called to request d/c and will do exercises independently. Quantae Martel C. Rainier Feuerborn PT, DPT 01/29/17 5:37 PM

## 2017-01-11 ENCOUNTER — Encounter: Payer: 59 | Admitting: Physical Therapy

## 2017-01-22 ENCOUNTER — Ambulatory Visit: Payer: 59

## 2017-01-24 ENCOUNTER — Ambulatory Visit
Admission: RE | Admit: 2017-01-24 | Discharge: 2017-01-24 | Disposition: A | Discharge status: Home / Self Care | Payer: 59 | Source: Ambulatory Visit | Attending: Family Medicine | Admitting: Family Medicine

## 2017-01-24 DIAGNOSIS — Z1231 Encounter for screening mammogram for malignant neoplasm of breast: Secondary | ICD-10-CM

## 2017-01-28 DIAGNOSIS — R55 Syncope and collapse: Secondary | ICD-10-CM | POA: Diagnosis not present

## 2017-01-28 DIAGNOSIS — R404 Transient alteration of awareness: Secondary | ICD-10-CM | POA: Diagnosis not present

## 2017-01-30 ENCOUNTER — Encounter: Payer: 59 | Admitting: Physical Therapy

## 2017-02-01 ENCOUNTER — Encounter: Payer: 59 | Admitting: Physical Therapy

## 2017-02-06 DIAGNOSIS — E782 Mixed hyperlipidemia: Secondary | ICD-10-CM | POA: Diagnosis not present

## 2017-02-09 DIAGNOSIS — E039 Hypothyroidism, unspecified: Secondary | ICD-10-CM | POA: Diagnosis not present

## 2017-02-09 DIAGNOSIS — E119 Type 2 diabetes mellitus without complications: Secondary | ICD-10-CM | POA: Diagnosis not present

## 2017-02-09 DIAGNOSIS — Z794 Long term (current) use of insulin: Secondary | ICD-10-CM | POA: Diagnosis not present

## 2017-02-12 DIAGNOSIS — E7800 Pure hypercholesterolemia, unspecified: Secondary | ICD-10-CM | POA: Diagnosis not present

## 2017-02-12 DIAGNOSIS — I10 Essential (primary) hypertension: Secondary | ICD-10-CM | POA: Diagnosis not present

## 2017-02-20 DIAGNOSIS — E119 Type 2 diabetes mellitus without complications: Secondary | ICD-10-CM | POA: Diagnosis not present

## 2017-02-20 DIAGNOSIS — E782 Mixed hyperlipidemia: Secondary | ICD-10-CM | POA: Diagnosis not present

## 2017-02-20 DIAGNOSIS — I10 Essential (primary) hypertension: Secondary | ICD-10-CM | POA: Diagnosis not present

## 2017-05-18 DIAGNOSIS — Z794 Long term (current) use of insulin: Secondary | ICD-10-CM | POA: Diagnosis not present

## 2017-05-18 DIAGNOSIS — E119 Type 2 diabetes mellitus without complications: Secondary | ICD-10-CM | POA: Diagnosis not present

## 2017-05-18 DIAGNOSIS — E039 Hypothyroidism, unspecified: Secondary | ICD-10-CM | POA: Diagnosis not present

## 2017-05-22 DIAGNOSIS — E782 Mixed hyperlipidemia: Secondary | ICD-10-CM | POA: Diagnosis not present

## 2017-05-22 DIAGNOSIS — Z794 Long term (current) use of insulin: Secondary | ICD-10-CM | POA: Diagnosis not present

## 2017-05-22 DIAGNOSIS — E119 Type 2 diabetes mellitus without complications: Secondary | ICD-10-CM | POA: Diagnosis not present

## 2017-05-30 DIAGNOSIS — L821 Other seborrheic keratosis: Secondary | ICD-10-CM | POA: Diagnosis not present

## 2017-05-30 DIAGNOSIS — H02826 Cysts of left eye, unspecified eyelid: Secondary | ICD-10-CM | POA: Diagnosis not present

## 2017-05-30 DIAGNOSIS — L570 Actinic keratosis: Secondary | ICD-10-CM | POA: Diagnosis not present

## 2017-06-01 DIAGNOSIS — Z23 Encounter for immunization: Secondary | ICD-10-CM | POA: Diagnosis not present

## 2017-06-27 DIAGNOSIS — L821 Other seborrheic keratosis: Secondary | ICD-10-CM | POA: Diagnosis not present

## 2017-06-27 DIAGNOSIS — D225 Melanocytic nevi of trunk: Secondary | ICD-10-CM | POA: Diagnosis not present

## 2017-06-27 DIAGNOSIS — L814 Other melanin hyperpigmentation: Secondary | ICD-10-CM | POA: Diagnosis not present

## 2017-06-27 DIAGNOSIS — D1801 Hemangioma of skin and subcutaneous tissue: Secondary | ICD-10-CM | POA: Diagnosis not present

## 2017-06-27 DIAGNOSIS — L280 Lichen simplex chronicus: Secondary | ICD-10-CM | POA: Diagnosis not present

## 2017-06-27 DIAGNOSIS — L859 Epidermal thickening, unspecified: Secondary | ICD-10-CM | POA: Diagnosis not present

## 2017-06-27 DIAGNOSIS — D485 Neoplasm of uncertain behavior of skin: Secondary | ICD-10-CM | POA: Diagnosis not present

## 2017-08-20 DIAGNOSIS — Z794 Long term (current) use of insulin: Secondary | ICD-10-CM | POA: Diagnosis not present

## 2017-08-20 DIAGNOSIS — E119 Type 2 diabetes mellitus without complications: Secondary | ICD-10-CM | POA: Diagnosis not present

## 2017-08-20 DIAGNOSIS — E039 Hypothyroidism, unspecified: Secondary | ICD-10-CM | POA: Diagnosis not present

## 2017-08-21 DIAGNOSIS — E119 Type 2 diabetes mellitus without complications: Secondary | ICD-10-CM | POA: Diagnosis not present

## 2017-08-21 DIAGNOSIS — I10 Essential (primary) hypertension: Secondary | ICD-10-CM | POA: Diagnosis not present

## 2017-08-21 DIAGNOSIS — E782 Mixed hyperlipidemia: Secondary | ICD-10-CM | POA: Diagnosis not present

## 2017-09-11 DIAGNOSIS — M542 Cervicalgia: Secondary | ICD-10-CM | POA: Diagnosis not present

## 2017-09-11 DIAGNOSIS — S161XXA Strain of muscle, fascia and tendon at neck level, initial encounter: Secondary | ICD-10-CM | POA: Diagnosis not present

## 2017-11-26 DIAGNOSIS — Z794 Long term (current) use of insulin: Secondary | ICD-10-CM | POA: Diagnosis not present

## 2017-11-26 DIAGNOSIS — E119 Type 2 diabetes mellitus without complications: Secondary | ICD-10-CM | POA: Diagnosis not present

## 2017-11-26 DIAGNOSIS — E039 Hypothyroidism, unspecified: Secondary | ICD-10-CM | POA: Diagnosis not present

## 2017-11-28 ENCOUNTER — Other Ambulatory Visit: Payer: Self-pay | Admitting: Family Medicine

## 2017-11-28 DIAGNOSIS — E039 Hypothyroidism, unspecified: Secondary | ICD-10-CM | POA: Diagnosis not present

## 2017-11-28 DIAGNOSIS — Z794 Long term (current) use of insulin: Secondary | ICD-10-CM | POA: Diagnosis not present

## 2017-11-28 DIAGNOSIS — Z23 Encounter for immunization: Secondary | ICD-10-CM | POA: Diagnosis not present

## 2017-11-28 DIAGNOSIS — H547 Unspecified visual loss: Secondary | ICD-10-CM

## 2017-11-28 DIAGNOSIS — E119 Type 2 diabetes mellitus without complications: Secondary | ICD-10-CM | POA: Diagnosis not present

## 2017-11-28 DIAGNOSIS — D649 Anemia, unspecified: Secondary | ICD-10-CM | POA: Diagnosis not present

## 2017-11-28 DIAGNOSIS — E7800 Pure hypercholesterolemia, unspecified: Secondary | ICD-10-CM | POA: Diagnosis not present

## 2017-11-28 DIAGNOSIS — E782 Mixed hyperlipidemia: Secondary | ICD-10-CM | POA: Diagnosis not present

## 2017-11-29 DIAGNOSIS — H5461 Unqualified visual loss, right eye, normal vision left eye: Secondary | ICD-10-CM | POA: Diagnosis not present

## 2017-11-30 DIAGNOSIS — I10 Essential (primary) hypertension: Secondary | ICD-10-CM | POA: Diagnosis not present

## 2017-11-30 DIAGNOSIS — E119 Type 2 diabetes mellitus without complications: Secondary | ICD-10-CM | POA: Diagnosis not present

## 2017-11-30 DIAGNOSIS — H547 Unspecified visual loss: Secondary | ICD-10-CM | POA: Diagnosis not present

## 2017-12-04 DIAGNOSIS — Z0000 Encounter for general adult medical examination without abnormal findings: Secondary | ICD-10-CM | POA: Diagnosis not present

## 2017-12-06 ENCOUNTER — Other Ambulatory Visit: Payer: Self-pay | Admitting: Family Medicine

## 2017-12-06 DIAGNOSIS — Z23 Encounter for immunization: Secondary | ICD-10-CM | POA: Diagnosis not present

## 2017-12-06 DIAGNOSIS — Z0000 Encounter for general adult medical examination without abnormal findings: Secondary | ICD-10-CM | POA: Diagnosis not present

## 2017-12-06 DIAGNOSIS — Z6826 Body mass index (BMI) 26.0-26.9, adult: Secondary | ICD-10-CM | POA: Diagnosis not present

## 2017-12-06 DIAGNOSIS — Z1231 Encounter for screening mammogram for malignant neoplasm of breast: Secondary | ICD-10-CM

## 2017-12-12 ENCOUNTER — Other Ambulatory Visit: Payer: Self-pay | Admitting: Family Medicine

## 2017-12-12 DIAGNOSIS — H547 Unspecified visual loss: Secondary | ICD-10-CM

## 2017-12-13 DIAGNOSIS — H539 Unspecified visual disturbance: Secondary | ICD-10-CM | POA: Diagnosis not present

## 2017-12-13 DIAGNOSIS — I10 Essential (primary) hypertension: Secondary | ICD-10-CM | POA: Diagnosis not present

## 2017-12-16 ENCOUNTER — Ambulatory Visit
Admission: RE | Admit: 2017-12-16 | Discharge: 2017-12-16 | Disposition: A | Discharge status: Home / Self Care | Payer: 59 | Source: Ambulatory Visit | Attending: Family Medicine | Admitting: Family Medicine

## 2017-12-16 DIAGNOSIS — H539 Unspecified visual disturbance: Secondary | ICD-10-CM | POA: Diagnosis not present

## 2017-12-16 DIAGNOSIS — H547 Unspecified visual loss: Secondary | ICD-10-CM

## 2017-12-16 MED ORDER — GADOBENATE DIMEGLUMINE 529 MG/ML IV SOLN
13.0000 mL | Freq: Once | INTRAVENOUS | Status: AC | PRN
  Administered 2017-12-16: 13 mL via INTRAVENOUS

## 2017-12-18 DIAGNOSIS — Z794 Long term (current) use of insulin: Secondary | ICD-10-CM | POA: Diagnosis not present

## 2017-12-18 DIAGNOSIS — I10 Essential (primary) hypertension: Secondary | ICD-10-CM | POA: Diagnosis not present

## 2017-12-18 DIAGNOSIS — D649 Anemia, unspecified: Secondary | ICD-10-CM | POA: Diagnosis not present

## 2017-12-18 DIAGNOSIS — E119 Type 2 diabetes mellitus without complications: Secondary | ICD-10-CM | POA: Diagnosis not present

## 2017-12-24 DIAGNOSIS — I10 Essential (primary) hypertension: Secondary | ICD-10-CM | POA: Diagnosis not present

## 2017-12-24 DIAGNOSIS — E079 Disorder of thyroid, unspecified: Secondary | ICD-10-CM | POA: Diagnosis not present

## 2017-12-24 DIAGNOSIS — N289 Disorder of kidney and ureter, unspecified: Secondary | ICD-10-CM | POA: Diagnosis not present

## 2018-01-15 DIAGNOSIS — Z794 Long term (current) use of insulin: Secondary | ICD-10-CM | POA: Diagnosis not present

## 2018-01-15 DIAGNOSIS — E1165 Type 2 diabetes mellitus with hyperglycemia: Secondary | ICD-10-CM | POA: Diagnosis not present

## 2018-01-25 ENCOUNTER — Ambulatory Visit: Payer: 59

## 2018-02-04 ENCOUNTER — Ambulatory Visit: Payer: 59 | Admitting: Neurology

## 2018-02-05 ENCOUNTER — Ambulatory Visit
Admission: RE | Admit: 2018-02-05 | Discharge: 2018-02-05 | Disposition: A | Discharge status: Home / Self Care | Payer: 59 | Source: Ambulatory Visit | Attending: Family Medicine | Admitting: Family Medicine

## 2018-02-05 DIAGNOSIS — Z1231 Encounter for screening mammogram for malignant neoplasm of breast: Secondary | ICD-10-CM

## 2018-04-09 DIAGNOSIS — E039 Hypothyroidism, unspecified: Secondary | ICD-10-CM | POA: Diagnosis not present

## 2018-04-09 DIAGNOSIS — E119 Type 2 diabetes mellitus without complications: Secondary | ICD-10-CM | POA: Diagnosis not present

## 2018-04-09 DIAGNOSIS — E782 Mixed hyperlipidemia: Secondary | ICD-10-CM | POA: Diagnosis not present

## 2018-04-11 DIAGNOSIS — E782 Mixed hyperlipidemia: Secondary | ICD-10-CM | POA: Diagnosis not present

## 2018-04-11 DIAGNOSIS — E119 Type 2 diabetes mellitus without complications: Secondary | ICD-10-CM | POA: Diagnosis not present

## 2018-04-11 DIAGNOSIS — I10 Essential (primary) hypertension: Secondary | ICD-10-CM | POA: Diagnosis not present

## 2018-04-11 DIAGNOSIS — N390 Urinary tract infection, site not specified: Secondary | ICD-10-CM | POA: Diagnosis not present

## 2018-04-11 DIAGNOSIS — Z794 Long term (current) use of insulin: Secondary | ICD-10-CM | POA: Diagnosis not present

## 2018-08-07 DIAGNOSIS — Z794 Long term (current) use of insulin: Secondary | ICD-10-CM | POA: Diagnosis not present

## 2018-08-07 DIAGNOSIS — E039 Hypothyroidism, unspecified: Secondary | ICD-10-CM | POA: Diagnosis not present

## 2018-08-07 DIAGNOSIS — E119 Type 2 diabetes mellitus without complications: Secondary | ICD-10-CM | POA: Diagnosis not present

## 2018-08-08 DIAGNOSIS — Z794 Long term (current) use of insulin: Secondary | ICD-10-CM | POA: Diagnosis not present

## 2018-08-08 DIAGNOSIS — E119 Type 2 diabetes mellitus without complications: Secondary | ICD-10-CM | POA: Diagnosis not present

## 2018-08-08 DIAGNOSIS — E782 Mixed hyperlipidemia: Secondary | ICD-10-CM | POA: Diagnosis not present

## 2019-04-17 ENCOUNTER — Encounter: Payer: Self-pay | Admitting: Obstetrics and Gynecology

## 2019-04-17 ENCOUNTER — Other Ambulatory Visit: Payer: Self-pay | Admitting: Family Medicine

## 2019-04-17 DIAGNOSIS — Z1231 Encounter for screening mammogram for malignant neoplasm of breast: Secondary | ICD-10-CM

## 2019-05-06 ENCOUNTER — Encounter: Payer: 59 | Admitting: Obstetrics and Gynecology

## 2019-05-21 IMAGING — MR MR HEAD WO/W CM
12 series · 48 of 48 positions shown · IV contrast (multihance)
Comparison: None.

CLINICAL DATA: Episode of vision disturbance on the right 1 month
ago.

Creatinine was obtained on site at [HOSPITAL] at [HOSPITAL].
Results: Creatinine 1.3 mg/dL.
EXAM:
MRI HEAD WITHOUT AND WITH CONTRAST
TECHNIQUE: Multiplanar, multiecho pulse sequences of the brain and surrounding
structures were obtained without and with intravenous contrast.
CONTRAST:  13mL MULTIHANCE GADOBENATE DIMEGLUMINE 529 MG/ML IV SOLN

[Series 2: T1 · sagittal · 5.0mm · 0.45mm/px · 1 of 21 slices shown]
[im 1/21]
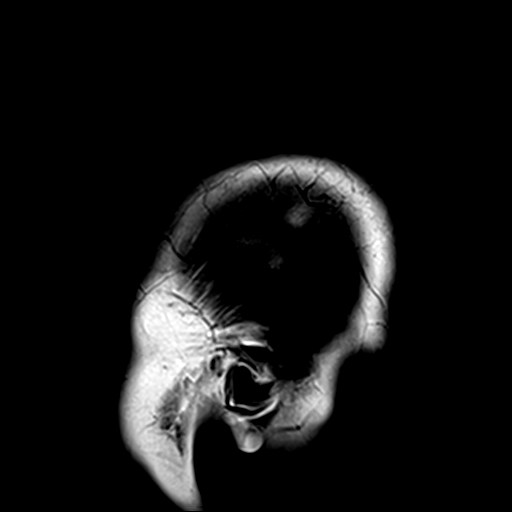

[Series 3: DWI · axial · 3.0mm · 1.80mm/px · z∈[-76,+69]mm · 7 of 100 slices shown (1 of 4)]
[im 1/100]
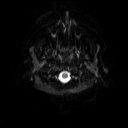
[im 17/100]
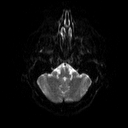
[im 34/100]
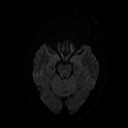
[im 50/100]
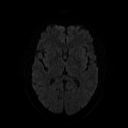
[im 67/100]
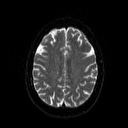
[im 83/100]
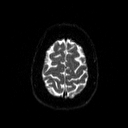
[im 100/100]
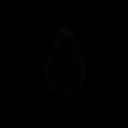

[Series 4: DWI · axial · 3.0mm · 1.80mm/px · z∈[-76,+69]mm · 3 of 50 slices shown (2 of 4)]
[im 1/50]
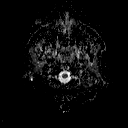
[im 25/50]
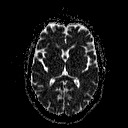
[im 50/50]
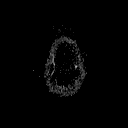

[Series 5: DWI · coronal · 5.0mm · 1.80mm/px · 5 of 66 slices shown (3 of 4)]
[im 1/66]
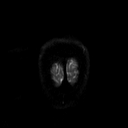
[im 17/66]
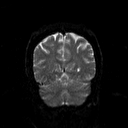
[im 33/66]
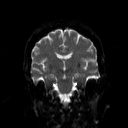
[im 49/66]
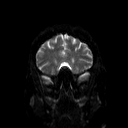
[im 66/66]
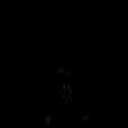

[Series 6: DWI · coronal · 5.0mm · 1.80mm/px · 2 of 34 slices shown (4 of 4)]
[im 1/34]
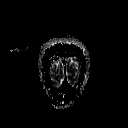
[im 34/34]
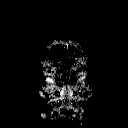

[Series 7: T2 · axial · 5.0mm · 0.51mm/px · z∈[-72,+68]mm · 2 of 22 slices shown (1 of 2)]
[im 1/22]
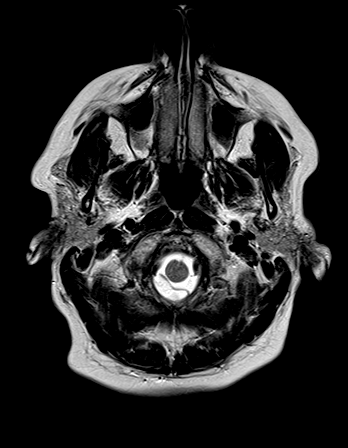
[im 22/22]
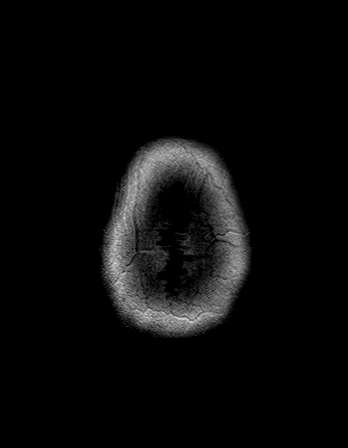

[Series 8: FLAIR · axial · 3.0mm · 0.45mm/px · z∈[-77,+70]mm · 2 of 33 slices shown]
[im 1/33]
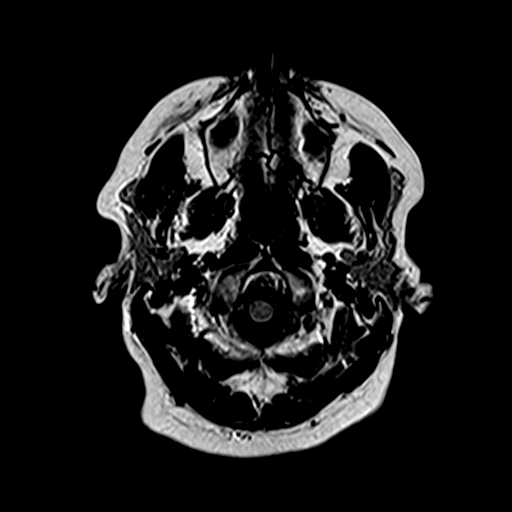
[im 33/33]
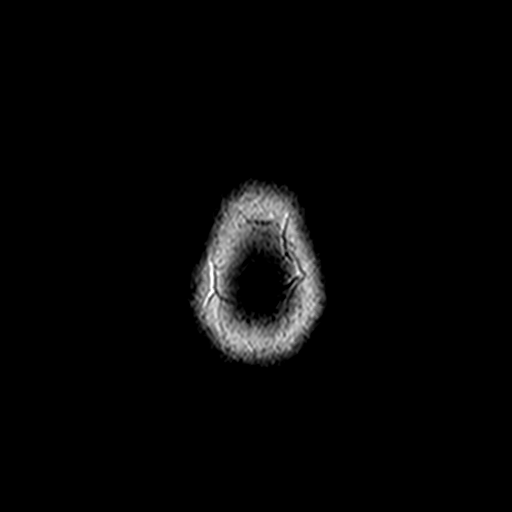

[Series 10: swi_images · axial · 5.0mm · 0.90mm/px · z∈[-75,+69]mm · 2 of 30 slices shown]
[im 1/30]
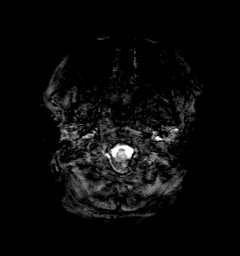
[im 30/30]
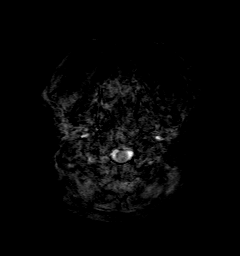

[Series 11: t1_mpr_tra · axial · 1.0mm · 0.75mm/px · z∈[-73,+69]mm · 10 of 144 slices shown (1 of 2)]
[im 1/144]
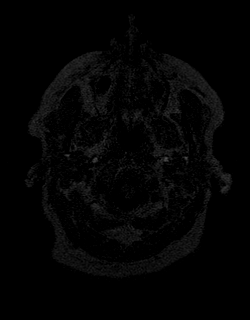
[im 16/144]
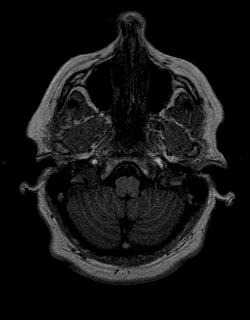
[im 32/144]
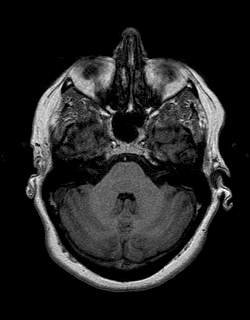
[im 48/144]
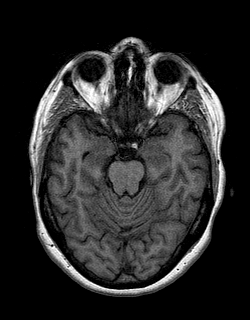
[im 64/144]
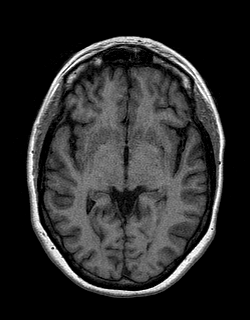
[im 80/144]
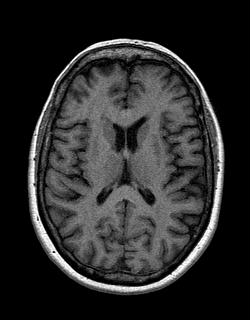
[im 96/144]
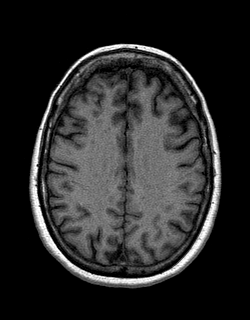
[im 112/144]
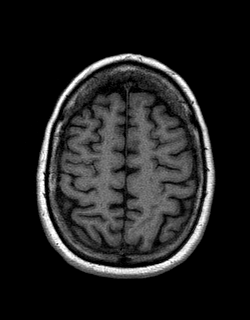
[im 128/144]
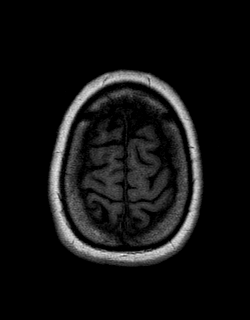
[im 144/144]
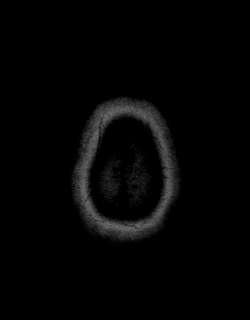

[Series 12: T2 · coronal · 5.0mm · 0.45mm/px · 2 of 25 slices shown (2 of 2)]
[im 1/25]
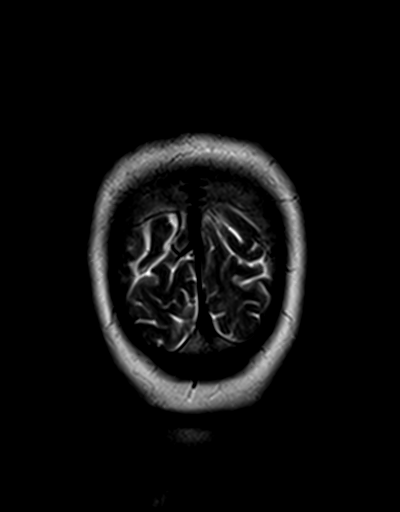
[im 25/25]
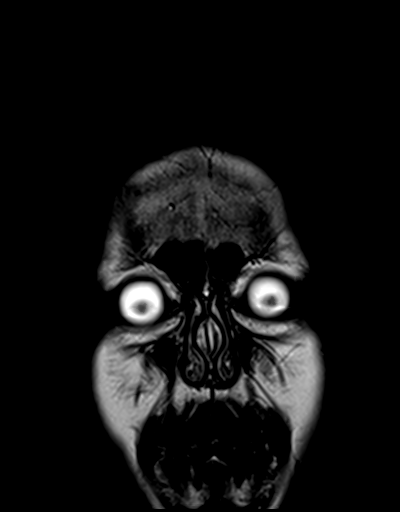

[Series 13: t1_mpr_tra · axial · 1.0mm · 0.75mm/px · z∈[-73,+69]mm · 10 of 144 slices shown (2 of 2)]
[im 1/144]
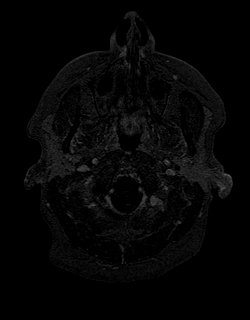
[im 16/144]
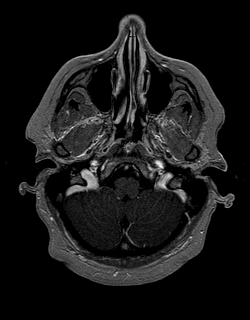
[im 32/144]
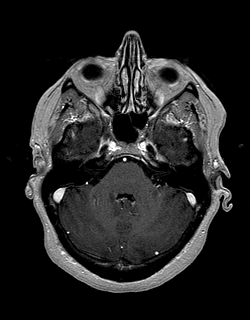
[im 48/144]
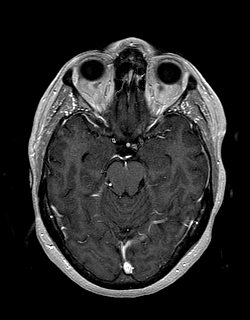
[im 64/144]
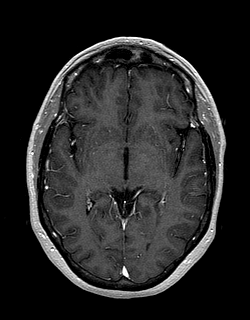
[im 80/144]
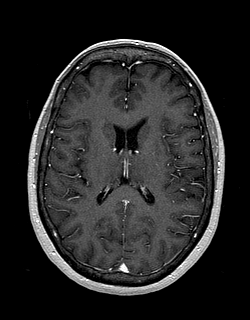
[im 96/144]
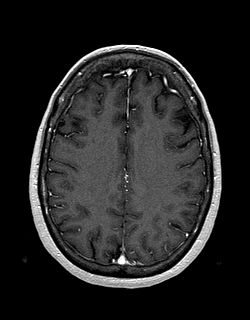
[im 112/144]
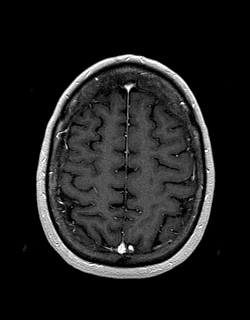
[im 128/144]
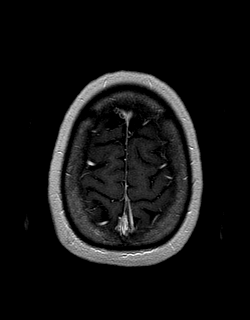
[im 144/144]
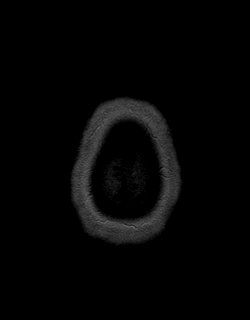

[Series 14: post cor · coronal · 5.0mm · 0.45mm/px · 2 of 25 slices shown]
[im 1/25]
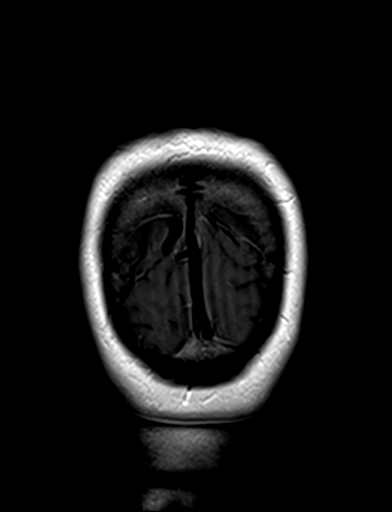
[im 25/25]
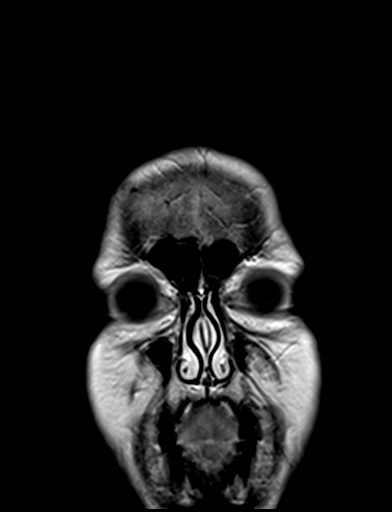

[48 of 48 positions shown; findings below may reference images not displayed]

FINDINGS: Brain: Diffusion imaging does not show any acute or subacute
infarction. Brainstem and cerebellum are normal. Cerebral
hemispheres are normal except for a few punctate foci of T2 and
FLAIR signal in the deep white matter consistent with early/mild
chronic small vessel ischemic change. No cortical or large vessel
territory infarction. No mass lesion, hemorrhage, hydrocephalus or
extra-axial collection. After contrast administration, no abnormal
enhancement occurs.

Vascular: Major vessels at the base of the brain show flow.

Skull and upper cervical spine: Negative

Sinuses/Orbits: Clear/normal

Other: None
IMPRESSION: No acute or subacute insult.

Normal except for a few punctate foci of T2 and FLAIR signal
scattered within the cerebral hemispheric white matter, consistent
with a minimal manifestation of small vessel change.

## 2019-05-27 ENCOUNTER — Ambulatory Visit: Payer: 59

## 2019-06-23 ENCOUNTER — Ambulatory Visit: Payer: 59

## 2019-06-24 ENCOUNTER — Ambulatory Visit: Payer: 59

## 2019-07-11 ENCOUNTER — Other Ambulatory Visit: Payer: Self-pay

## 2019-07-11 ENCOUNTER — Ambulatory Visit
Admission: RE | Admit: 2019-07-11 | Discharge: 2019-07-11 | Disposition: A | Discharge status: Home / Self Care | Payer: 59 | Source: Ambulatory Visit | Attending: Family Medicine | Admitting: Family Medicine

## 2019-07-11 DIAGNOSIS — Z1231 Encounter for screening mammogram for malignant neoplasm of breast: Secondary | ICD-10-CM

## 2019-07-14 ENCOUNTER — Ambulatory Visit: Payer: 59 | Attending: Internal Medicine

## 2019-07-14 DIAGNOSIS — Z23 Encounter for immunization: Secondary | ICD-10-CM

## 2019-07-14 NOTE — Progress Notes (Signed)
   Covid-[facility] Vaccination Clinic  Name:  Maria Stafford    MRN: [place]:[id number] DOB: 1961-02-11  07/14/2019  [name] was observed post Covid-19 immunization for 15 minutes without incident. She was provided with Vaccine Information Sheet and instruction to access the V-Safe system.   [name] was instructed to call 911 with any severe reactions post vaccine: Marland Kitchen Difficulty breathing  . Swelling of face and throat  . A fast heartbeat  . A bad rash all over body  . Dizziness and weakness   Immunizations Administered    Name Date Dose VIS Date Route   Pfizer COVID-19 Vaccine 07/14/2019 12:31 PM 0.3 mL 03/14/2019 Intramuscular   Manufacturer: [place]   Lot: [id number]   [place]: [id number]

## 2019-07-28 ENCOUNTER — Other Ambulatory Visit: Payer: Self-pay

## 2019-07-28 ENCOUNTER — Other Ambulatory Visit: Payer: Self-pay | Admitting: Family Medicine

## 2019-07-28 ENCOUNTER — Ambulatory Visit
Admission: RE | Admit: 2019-07-28 | Discharge: 2019-07-28 | Disposition: A | Discharge status: Home / Self Care | Payer: 59 | Source: Ambulatory Visit | Attending: Family Medicine | Admitting: Family Medicine

## 2019-07-28 DIAGNOSIS — M25551 Pain in right hip: Secondary | ICD-10-CM

## 2019-07-28 DIAGNOSIS — M25561 Pain in right knee: Secondary | ICD-10-CM

## 2019-08-05 ENCOUNTER — Ambulatory Visit: Payer: 59 | Attending: Internal Medicine

## 2019-08-05 DIAGNOSIS — Z23 Encounter for immunization: Secondary | ICD-10-CM

## 2019-08-05 NOTE — Progress Notes (Signed)
   Covid-19 Vaccination Clinic  Name:  Maria Stafford    MRN: [place]:[id number] DOB: 08/13/1960  08/05/2019  [name] was observed post Covid-19 immunization for 15 minutes without incident. She was provided with Vaccine Information Sheet and instruction to access the V-Safe system.   [name] was instructed to call 911 with any severe reactions post vaccine: Marland Kitchen Difficulty breathing  . Swelling of face and throat  . A fast heartbeat  . A bad rash all over body  . Dizziness and weakness   Immunizations Administered    Name Date Dose VIS Date Route   Pfizer COVID-19 Vaccine 08/05/2019  8:48 AM 0.3 mL 05/28/2018 Intramuscular   Manufacturer: [place]   Lot: [id number]   [place]: [id number]
